# Patient Record
Sex: Male | Born: 1975 | Race: Black or African American | Hispanic: No | Marital: Married | Smoking: Never smoker
Health system: Southern US, Community
[De-identification: ages and names within clinical notes are randomized; demographics above are authoritative.]

## PROBLEM LIST (undated history)

## (undated) DIAGNOSIS — Z9109 Other allergy status, other than to drugs and biological substances: Secondary | ICD-10-CM

## (undated) HISTORY — PX: FRACTURE SURGERY: SHX138

---

## 1998-07-18 HISTORY — PX: OTHER SURGICAL HISTORY: SHX169

## 2000-07-18 HISTORY — PX: MENISCUS REPAIR: SHX5179

## 2005-07-18 HISTORY — PX: OTHER SURGICAL HISTORY: SHX169

## 2009-03-13 ENCOUNTER — Ambulatory Visit: Payer: Self-pay | Admitting: Diagnostic Radiology

## 2009-03-13 ENCOUNTER — Emergency Department (HOSPITAL_BASED_OUTPATIENT_CLINIC_OR_DEPARTMENT_OTHER): Admission: EM | Admit: 2009-03-13 | Discharge: 2009-03-13 | Payer: Self-pay | Admitting: Emergency Medicine

## 2010-07-18 HISTORY — PX: KNEE ARTHROSCOPY: SHX127

## 2012-02-02 ENCOUNTER — Other Ambulatory Visit: Payer: Self-pay | Admitting: Emergency Medicine

## 2012-02-02 ENCOUNTER — Ambulatory Visit (INDEPENDENT_AMBULATORY_CARE_PROVIDER_SITE_OTHER): Payer: 59 | Admitting: Emergency Medicine

## 2012-02-02 VITALS — BP 112/70 | HR 79 | Temp 97.8°F | Resp 18 | Ht 73.0 in | Wt 254.0 lb

## 2012-02-02 DIAGNOSIS — Z0000 Encounter for general adult medical examination without abnormal findings: Secondary | ICD-10-CM

## 2012-06-01 NOTE — Progress Notes (Signed)
  Subjective:    Patient ID: Wesley Boyer, male    DOB: 1976/03/07, 36 y.o.   MRN: [id number]  HPI  Physical.  No complaints  Review of Systems As per HPI, otherwise negative.      Objective:   Physical Exam  /\GEN: WDWN, NAD, Non-toxic, A & O x 3 HEENT: Atraumatic, Normocephalic. Neck supple. No masses, No LAD. Ears and Nose: No external deformity. CV: RRR, No M/G/R. No JVD. No thrill. No extra heart sounds. PULM: CTA B, no wheezes, crackles, rhonchi. No retractions. No resp. distress. No accessory muscle use. ABD: S, NT, ND, +BS. No rebound. No HSM. EXTR: No c/c/e NEURO Normal gait.  PSYCH: Normally interactive. Conversant. Not depressed or anxious appearing.  Calm demeanor.        Assessment & Plan:   Wellness examination Labs

## 2012-06-02 NOTE — Progress Notes (Signed)
Reviewed and agree.

## 2012-08-23 ENCOUNTER — Ambulatory Visit (INDEPENDENT_AMBULATORY_CARE_PROVIDER_SITE_OTHER): Payer: 59 | Admitting: Physician Assistant

## 2012-08-23 VITALS — BP 122/78 | HR 103 | Temp 98.2°F | Resp 17 | Ht 72.5 in | Wt 270.0 lb

## 2012-08-23 DIAGNOSIS — Z6836 Body mass index (BMI) 36.0-36.9, adult: Secondary | ICD-10-CM

## 2012-08-23 DIAGNOSIS — Z719 Counseling, unspecified: Secondary | ICD-10-CM

## 2012-08-23 NOTE — Progress Notes (Signed)
   Patient ID: Wesley Boyer MRN: [id number], DOB: 11-29-1975, 37 y.o. Date of Encounter: 08/23/2012, 11:18 AM  Primary Physician: No primary provider on file.  Chief Complaint: Insurance form completion  HPI: 37 y.o. year old male with history below presents for form completion. Patient had CPE here on 02/02/12. Labs not drawn at that time as patient had somewhere to be. Patient now presents for insurance form completion. Needs total cholesterol drawn. Had a bowl of cereal this morning around 5:30 am. Tries to eat healthy. Exercises 3-5 times weekly. No issues or complaints today.    History reviewed. No pertinent past medical history.   Home Meds: Prior to Admission medications   Medication Sig Start Date End Date Taking? Authorizing Provider  Multiple Vitamin (MULTIVITAMIN) tablet Take 1 tablet by mouth daily.    Historical Provider, MD    Allergies: No Known Allergies  History   Social History  . Marital Status: Married    Spouse Name: N/A    Number of Children: N/A  . Years of Education: N/A   Occupational History  . Not on file.   Social History Main Topics  . Smoking status: Never Smoker   . Smokeless tobacco: Not on file  . Alcohol Use: No     Comment: social  . Drug Use: No  . Sexually Active: Yes    Birth Control/ Protection: None   Other Topics Concern  . Not on file   Social History Narrative  . No narrative on file     Review of Systems: Constitutional: negative for chills, fever, night sweats, weight changes, or fatigue  Cardiovascular: negative for chest pain or palpitations Respiratory: negative for hemoptysis, wheezing, shortness of breath, or cough Abdominal: negative for abdominal pain, nausea, vomiting, diarrhea, or constipation Dermatological: negative for rash Neurologic: negative for headache, dizziness, or syncope All other systems reviewed and are otherwise negative with the exception to those above and in the HPI.   Physical Exam: Blood  pressure 122/78, pulse 103, temperature 98.2 F (36.8 C), temperature source Oral, resp. rate 17, height 6' 0.5" (1.842 m), weight 270 lb (122.471 kg), SpO2 99.00%., Body mass index is 36.12 kg/(m^2). General: Well developed, well nourished, in no acute distress. Head: Normocephalic, atraumatic, eyes without discharge, sclera non-icteric, nares are without discharge. Bilateral auditory canals clear, TM's are without perforation, pearly grey and translucent with reflective cone of light bilaterally. Oral cavity moist, posterior pharynx without exudate, erythema, peritonsillar abscess, or post nasal drip.  Neck: Supple. No thyromegaly. Full ROM. No lymphadenopathy. Lungs: Clear bilaterally to auscultation without wheezes, rales, or rhonchi. Breathing is unlabored. Heart: RRR with S1 S2. No murmurs, rubs, or gallops appreciated. Msk:  Strength and tone normal for age. Extremities/Skin: Warm and dry. No clubbing or cyanosis. No edema. No rashes or suspicious lesions. Neuro: Alert and oriented X 3. Moves all extremities spontaneously. Gait is normal. CNII-XII grossly in tact. Psych:  Responds to questions appropriately with a normal affect.   Labs: Total cholesterol pending.   ASSESSMENT AND PLAN:  37 y.o. year old male here for insurance form completion. -Total cholesterol drawn -Fill out form once lab is back -Holding form in my box   Signed, Eula Listen, PA-C 08/23/2012 11:18 AM

## 2012-09-03 ENCOUNTER — Ambulatory Visit (INDEPENDENT_AMBULATORY_CARE_PROVIDER_SITE_OTHER): Payer: Managed Care, Other (non HMO) | Admitting: Physician Assistant

## 2012-09-03 ENCOUNTER — Encounter: Payer: Self-pay | Admitting: Emergency Medicine

## 2012-09-03 VITALS — BP 143/90 | HR 113 | Temp 100.1°F | Resp 17 | Ht 72.5 in | Wt 274.0 lb

## 2012-09-03 DIAGNOSIS — IMO0001 Reserved for inherently not codable concepts without codable children: Secondary | ICD-10-CM

## 2012-09-03 DIAGNOSIS — M791 Myalgia, unspecified site: Secondary | ICD-10-CM

## 2012-09-03 DIAGNOSIS — R05 Cough: Secondary | ICD-10-CM

## 2012-09-03 DIAGNOSIS — R509 Fever, unspecified: Secondary | ICD-10-CM

## 2012-09-03 DIAGNOSIS — J029 Acute pharyngitis, unspecified: Secondary | ICD-10-CM

## 2012-09-03 LAB — POCT CBC
HCT, POC: 48.2 % (ref 43.5–53.7)
Hemoglobin: 16.3 g/dL (ref 14.1–18.1)
Lymph, poc: 1.5 (ref 0.6–3.4)
MCH, POC: 29 pg (ref 27–31.2)
MCHC: 33.8 g/dL (ref 31.8–35.4)
MCV: 85.8 fL (ref 80–97)
MPV: 9.2 fL (ref 0–99.8)
POC MID %: 10.2 %M (ref 0–12)
RBC: 5.62 M/uL (ref 4.69–6.13)
WBC: 5.9 10*3/uL (ref 4.6–10.2)

## 2012-09-03 NOTE — Progress Notes (Signed)
  Subjective:    Patient ID: Wesley Boyer, male    DOB: 06-30-1976, 37 y.o.   MRN: [id number]  HPI A 37 year old male presents with sore throat and muscle soreness since 4 am this morning.  The patient admits to "hacking red,yellow, green phlegm."  He admits to fevers throughout the day ranging from 99.0-100.3.  He has used Geneticist, molecular and Nyquil with no relief.  The sxs have progressively worsened throughout the day.  Pt denies congestion, ear pain, allergies, or hx of high BP.  No Known Allergies History reviewed. No pertinent past medical history. Past Surgical History  Procedure Laterality Date  . Knee arthroscopy  2012  . Meniscus repair  2002  . Right leg fracture repair  2007    October  . Left wrist repair  2000    Torn ligament   Family History  Problem Relation Age of Onset  . Stroke Mother   . Diabetes Mother   . Hypertension Father   . Clotting disorder Father   . Diabetes Brother    Review of Systems As above    Objective:   Physical Exam BP 143/90  Pulse 113  Temp(Src) 100.1 F (37.8 C) (Oral)  Resp 17  Ht 6' 0.5" (1.842 m)  Wt 274 lb (124.286 kg)  BMI 36.63 kg/m2  SpO2 97%  HEENT:  Erythema of TM in left ear.  No turbinate edema, no sinus tenderness.  Positive tonsillar and preauricular lymphadenopathy. Neck:  Supple.  No cervical lymphadenopathy. Skin:  Warm and moist.  Negative turgor. Lungs:  CTAB Heart:  Normal S1, S2.  Tachy.  No m/g/r.  Results for orders placed in visit on 09/03/12  POCT CBC      Result Value Range   WBC 5.9  4.6 - 10.2 K/uL   Lymph, poc 1.5  0.6 - 3.4   POC LYMPH PERCENT 26.2  10 - 50 %L   MID (cbc) 0.6  0 - 0.9   POC MID % 10.2  0 - 12 %M   POC Granulocyte 3.8  2 - 6.9   Granulocyte percent 63.6  37 - 80 %G   RBC 5.62  4.69 - 6.13 M/uL   Hemoglobin 16.3  14.1 - 18.1 g/dL   HCT, POC 40.9  81.1 - 53.7 %   MCV 85.8  80 - 97 fL   MCH, POC 29.0  27 - 31.2 pg   MCHC 33.8  31.8 - 35.4 g/dL   RDW, POC 91.4     Platelet  Count, POC 243  142 - 424 K/uL   MPV 9.2  0 - 99.8 fL  POCT INFLUENZA A/B      Result Value Range   Influenza A, POC Negative     Influenza B, POC Negative         Assessment & Plan:  Acute pharyngitis  Muscle pain - Plan: POCT Influenza A/B  Fever, unspecified - Plan: POCT CBC, POCT Influenza A/B  Cough - Plan: POCT Influenza A/B  Viral Illness.  Supportive care. RTC if symptoms worsen/persist.

## 2012-09-03 NOTE — Patient Instructions (Signed)
Advised patient to orally hydrate and rest.  Pt should take Tylenol for pain as needed.  RTC if symptoms worsen or if onset of new symptoms.

## 2012-10-03 ENCOUNTER — Encounter: Payer: Self-pay | Admitting: Family Medicine

## 2013-09-14 ENCOUNTER — Ambulatory Visit (INDEPENDENT_AMBULATORY_CARE_PROVIDER_SITE_OTHER): Payer: Managed Care, Other (non HMO) | Admitting: Physician Assistant

## 2013-09-14 VITALS — BP 122/72 | HR 103 | Temp 98.5°F | Resp 20 | Ht 72.5 in | Wt 279.0 lb

## 2013-09-14 DIAGNOSIS — R51 Headache: Secondary | ICD-10-CM

## 2013-09-14 DIAGNOSIS — R519 Headache, unspecified: Secondary | ICD-10-CM

## 2013-09-14 DIAGNOSIS — M62838 Other muscle spasm: Secondary | ICD-10-CM

## 2013-09-14 DIAGNOSIS — Z0000 Encounter for general adult medical examination without abnormal findings: Secondary | ICD-10-CM

## 2013-09-14 DIAGNOSIS — Z833 Family history of diabetes mellitus: Secondary | ICD-10-CM

## 2013-09-14 DIAGNOSIS — R000 Tachycardia, unspecified: Secondary | ICD-10-CM

## 2013-09-14 LAB — POCT URINALYSIS DIPSTICK
BILIRUBIN UA: NEGATIVE
Glucose, UA: NEGATIVE
Leukocytes, UA: NEGATIVE
NITRITE UA: NEGATIVE
PH UA: 7
Protein, UA: NEGATIVE
RBC UA: NEGATIVE
SPEC GRAV UA: 1.02
UROBILINOGEN UA: 0.2

## 2013-09-14 MED ORDER — CYCLOBENZAPRINE HCL 5 MG PO TABS: 5.0000 mg | ORAL_TABLET | Freq: Three times a day (TID) | ORAL | Status: DC | PRN

## 2013-09-14 NOTE — Progress Notes (Signed)
   Subjective:    Patient ID: Wesley Boyer, male    DOB: 09-02-1975, 38 y.o.   MRN: [id number]  HPI Pt presents to clinic for his CPE.  He is doing well other than some headaches that he attributes to neck muscle spasms.  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 He has never seen an eye dr.  He goes to the dentist regularly.  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  Does Yoga regularly.  He cannot lift weight though he wants to because he build to much muscle mass when he does work-out.  Review of Systems  Constitutional: Negative.   Eyes: Negative.   Respiratory: Negative.   Cardiovascular: Negative.   Gastrointestinal: Negative.   Endocrine: Negative.   Genitourinary: Negative.   Musculoskeletal: Negative.   Skin: Negative.   Allergic/Immunologic: Negative.   Neurological: Positive for headaches (needs a different pillow - feels like he has muscle spasms that are contributing to his headaches).  Hematological: Negative.   Psychiatric/Behavioral: Negative.        Objective:   Physical Exam  Vitals reviewed. Constitutional: He is oriented to person, place, and time. He appears well-developed and well-nourished.  HENT:  Head: Normocephalic and atraumatic.  Right Ear: Hearing, tympanic membrane, external ear and ear canal normal.  Left Ear: Hearing, tympanic membrane, external ear and ear canal normal.  Nose: Nose normal.  Mouth/Throat: Uvula is midline, oropharynx is clear and moist and mucous membranes are normal.  Eyes: Conjunctivae are normal. Pupils are equal, round, and reactive to light.  Neck: Normal range of motion. Neck supple.  Cardiovascular: Regular rhythm and normal heart sounds.  Tachycardia present.   No murmur heard. Pulmonary/Chest: Effort normal and breath sounds normal. He has no wheezes.  Abdominal: Soft. Bowel sounds are normal. Hernia  confirmed negative in the right inguinal area and confirmed negative in the left inguinal area.  Genitourinary: Testes normal and penis normal.  Musculoskeletal:       Cervical back: He exhibits spasm (trapezius). He exhibits normal range of motion.  Pt is very muscular.  Neurological: He is alert and oriented to person, place, and time. He has normal reflexes.  Skin: Skin is warm and dry.  Psychiatric: He has a normal mood and affect. His behavior is normal. Judgment and thought content normal.      Assessment & Plan:  Annual physical exam - Plan: POCT urinalysis dipstick, Lipid panel, CBC  Tachycardia - Last 3 visits pt's HR has been >100- so we will check thyroid - pt is not aware of his elevated HR.  Plan: COMPLETE METABOLIC PANEL WITH GFR, TSH  Family history of diabetes mellitus - Plan: COMPLETE METABOLIC PANEL WITH GFR, Lipid panel  Headache  Muscle spasm - Plan: cyclobenzaprine (FLEXERIL) 5 MG tablet  He does not want his lab work done today because he ate pizza about 2 hours ago.  He will RTC in 2-3 days for fasting labs.  We will start Flexeril to help with his muscle spasms in hopes that we can improve his HAs.  He is will also use OTC NSAID intermittently to help with the pain.  He plans on getting a new pillow but it is a custom made pillow so he has to order it.    Benny LennertSarah Weber [facility] 09/14/2013 6:46 PM

## 2014-01-30 ENCOUNTER — Telehealth: Payer: Self-pay | Admitting: *Deleted

## 2014-01-30 NOTE — Telephone Encounter (Addendum)
Patient dropped off physical form, Dr. Katrinka BlazingSmith was able to fill out everything except for vision. Ok per Dr. Katrinka BlazingSmith to use vision from 2014, with proper documentation. Left message for patient regarding this. Awaiting return phone call from patient for ok to use old vision or if he would like to come back for vision exam.

## 2014-01-31 ENCOUNTER — Telehealth: Payer: Self-pay | Admitting: *Deleted

## 2014-01-31 NOTE — Telephone Encounter (Signed)
Form received to complete for CPE- added vision from CPE 2014. Copy sent to scan.  Patients wife notified to pick up. [phone]. Completed form in pick up drawer.

## 2014-07-15 ENCOUNTER — Ambulatory Visit (INDEPENDENT_AMBULATORY_CARE_PROVIDER_SITE_OTHER): Payer: Managed Care, Other (non HMO) | Admitting: Physician Assistant

## 2014-07-15 VITALS — BP 128/82 | HR 108 | Temp 99.0°F | Resp 17 | Ht 73.0 in | Wt 272.0 lb

## 2014-07-15 DIAGNOSIS — J069 Acute upper respiratory infection, unspecified: Secondary | ICD-10-CM

## 2014-07-15 MED ORDER — GUAIFENESIN ER 1200 MG PO TB12: 1.0000 | ORAL_TABLET | Freq: Two times a day (BID) | ORAL | Status: DC | PRN

## 2014-07-15 MED ORDER — LORATADINE-PSEUDOEPHEDRINE ER 5-120 MG PO TB12: 1.0000 | ORAL_TABLET | Freq: Every day | ORAL | Status: AC

## 2014-07-15 MED ORDER — OXYMETAZOLINE HCL 0.05 % NA SOLN: 2.0000 | Freq: Every day | NASAL | Status: AC

## 2014-07-15 NOTE — Progress Notes (Signed)
IDENTIFYING INFORMATION  Wesley MaxinMaurice Boyer / DOB: 1975/10/18 / MRN: [id number]  The patient  does not have a problem list on file.  SUBJECTIVE  CC: Nasal Congestion and Sore Throat   HPI: [name] is a 38 y.o. y.o. well appearing male presenting with 2 weeks of nasal congestion. He started feeling better 4 days ago, but started having a sore throat 2 days ago, and his nasal congestion came back.   He has not missed any work.  He has been drinking copious fluids.  He has tried vitamin C along with Alka Seltzer cough and cold OTC which gives him good relief for four hours.  He reports his daughter is in preschool and has been sick with a cold for the last few days.  He would like a Z-Pack to "clear things up."     He  has no past medical history on file.    He has a current medication list which includes the following prescription(s): vitamin c, multivitamin, and cyclobenzaprine.  [name] has No Known Allergies. He  reports that he has never smoked. He does not have any smokeless tobacco history on file. He reports that he drinks alcohol. He reports that he does not use illicit drugs. He  reports that he currently engages in sexual activity. He reports using the following method of birth control/protection: None.  The patient  has past surgical history that includes Knee arthroscopy (2012); Meniscus repair (2002); right leg fracture repair (2007); and Left wrist repair (2000).  His family history includes Atrial fibrillation in his brother; Clotting disorder in his father; Diabetes in his brother and mother; Hypertension in his father; Stroke in his mother.  Review of Systems  Constitutional: Negative for fever, chills, weight loss, malaise/fatigue and diaphoresis.  HENT: Positive for congestion and sore throat.   Eyes: Negative for discharge and redness.  Respiratory: Negative for cough, shortness of breath and wheezing.   Cardiovascular: Negative for chest pain and palpitations.    Gastrointestinal: Negative.   Genitourinary: Negative.   Skin: Negative.   Neurological: Negative for dizziness, weakness and headaches.    OBJECTIVE  Blood pressure 128/82, pulse 108 (rechecked by me on auscultation at 84 bpm), temperature 99 F (37.2 C), temperature source Oral, resp. rate 17, height 6\' 1"  (1.854 m), weight 272 lb (123.378 kg), SpO2 96 %. The patient's body mass index is 35.89 kg/(m^2).  Physical Exam  Constitutional: He is oriented to person, place, and time. He appears well-developed and well-nourished. No distress.  HENT:  Right Ear: Hearing, external ear and ear canal normal. No drainage. Tympanic membrane is not retracted and not bulging. No middle ear effusion.  Left Ear: Hearing, external ear and ear canal normal. No drainage. Tympanic membrane is not retracted and not bulging.  No middle ear effusion.  Nose: Mucosal edema and rhinorrhea (clear) present. Right sinus exhibits no maxillary sinus tenderness and no frontal sinus tenderness. Left sinus exhibits no maxillary sinus tenderness and no frontal sinus tenderness.  Mouth/Throat: Uvula is midline and mucous membranes are normal. Posterior oropharyngeal erythema present. No oropharyngeal exudate, posterior oropharyngeal edema or tonsillar abscesses.  Cardiovascular: Normal rate, regular rhythm and normal heart sounds.   Respiratory: Effort normal and breath sounds normal. He has no wheezes.  Neurological: He is alert and oriented to person, place, and time.  Skin: Skin is warm and dry. He is not diaphoretic. No erythema. No pallor.  Psychiatric: He has a normal mood and affect. His behavior is  normal. Judgment and thought content normal.    No results found for this or any previous visit (from the past 24 hour(s)).  ASSESSMENT & PLAN  Wesley AlstromMaurice was seen today for nasal congestion and sore throat.  Diagnoses and associated orders for this visit:  Viral URI:  The lack of findings on physical exam are  reassuring.   - loratadine-pseudoephedrine (CLARITIN-D 12 HOUR) 5-120 MG per tablet; Take 1 tablet by mouth daily with breakfast. - oxymetazoline (AFRIN NASAL SPRAY) 0.05 % nasal spray; Place 2 sprays into both nostrils at bedtime. Three days only.  - Guaifenesin (MUCINEX MAXIMUM STRENGTH) 1200 MG TB12; Take 1 tablet (1,200 mg total) by mouth every 12 (twelve) hours as needed.    The patient was instructed to to call or comeback to clinic as needed, or should symptoms warrant.  Deliah BostonMichael Clark, MHS, PA-C [facility] 07/15/2014 2:19 PM

## 2016-06-04 ENCOUNTER — Encounter (HOSPITAL_BASED_OUTPATIENT_CLINIC_OR_DEPARTMENT_OTHER): Payer: Self-pay | Admitting: *Deleted

## 2016-06-04 ENCOUNTER — Emergency Department (HOSPITAL_BASED_OUTPATIENT_CLINIC_OR_DEPARTMENT_OTHER)
Admission: EM | Admit: 2016-06-04 | Discharge: 2016-06-04 | Disposition: A | Discharge status: Home / Self Care | Payer: BLUE CROSS/BLUE SHIELD | Attending: Emergency Medicine | Admitting: Emergency Medicine

## 2016-06-04 DIAGNOSIS — Z23 Encounter for immunization: Secondary | ICD-10-CM | POA: Diagnosis not present

## 2016-06-04 DIAGNOSIS — Y999 Unspecified external cause status: Secondary | ICD-10-CM | POA: Insufficient documentation

## 2016-06-04 DIAGNOSIS — S0101XA Laceration without foreign body of scalp, initial encounter: Secondary | ICD-10-CM | POA: Diagnosis present

## 2016-06-04 DIAGNOSIS — W208XXA Other cause of strike by thrown, projected or falling object, initial encounter: Secondary | ICD-10-CM | POA: Insufficient documentation

## 2016-06-04 DIAGNOSIS — Y9289 Other specified places as the place of occurrence of the external cause: Secondary | ICD-10-CM | POA: Diagnosis not present

## 2016-06-04 DIAGNOSIS — Y9389 Activity, other specified: Secondary | ICD-10-CM | POA: Diagnosis not present

## 2016-06-04 MED ORDER — LIDOCAINE-EPINEPHRINE (PF) 2 %-1:200000 IJ SOLN
INTRAMUSCULAR | Status: AC
  Administered 2016-06-04: 20 mL
  Filled 2016-06-04: qty 20

## 2016-06-04 MED ORDER — LIDOCAINE-EPINEPHRINE (PF) 2 %-1:200000 IJ SOLN
INTRAMUSCULAR | Status: AC
  Filled 2016-06-04: qty 20

## 2016-06-04 MED ORDER — TETANUS-DIPHTH-ACELL PERTUSSIS 5-2.5-18.5 LF-MCG/0.5 IM SUSP
0.5000 mL | Freq: Once | INTRAMUSCULAR | Status: AC
  Administered 2016-06-04: 0.5 mL via INTRAMUSCULAR
  Filled 2016-06-04: qty 0.5

## 2016-06-04 MED ORDER — LIDOCAINE-EPINEPHRINE (PF) 2 %-1:200000 IJ SOLN
10.0000 mL | Freq: Once | INTRAMUSCULAR | Status: AC
  Administered 2016-06-04: 10 mL via INTRADERMAL

## 2016-06-04 MED ORDER — TRAMADOL HCL 50 MG PO TABS
50.0000 mg | ORAL_TABLET | Freq: Four times a day (QID) | ORAL | 0 refills | Status: AC | PRN
Start: 2016-06-04 — End: ?

## 2016-06-04 NOTE — ED Provider Notes (Signed)
[facility] Provider Note   CSN: [id number] Arrival date & time: 06/04/16  1921   By signing my name below, I, Clarisse GougeXavier Herndon, attest that this documentation has been prepared under the direction and in the presence of Fayrene HelperBowie Shonika Kolasinski. Electronically Signed: Clarisse GougeXavier Herndon, Scribe. 06/04/16. 8:04 PM.   History   Chief Complaint Chief Complaint  Patient presents with  . Head Laceration   The history is provided by the patient. No language interpreter was used.   HPI Comments: Wesley Boyer is a 40 y.o. male who presents to the Emergency Department s/p a head laceration that occurred ~ 6:15PM today. He states that the lid of the grill hit his head as he was preparing to return home from a cookout. Pt denies syncope, nausea, vomiting, numbness and tingling. Unknown last tetanus. Report pain is sharp, 6/10, non radiating.      History reviewed. No pertinent past medical history.  There are no active problems to display for this patient.   Past Surgical History:  Procedure Laterality Date  . FRACTURE SURGERY     elbows knees and wrist   . KNEE ARTHROSCOPY  2012  . Left wrist repair  2000   Torn ligament  . MENISCUS REPAIR  2002  . right leg fracture repair  2007   October       Home Medications    Prior to Admission medications   Medication Sig Start Date End Date Taking? Authorizing Provider  Multiple Vitamin (MULTIVITAMIN) tablet Take 1 tablet by mouth daily.    Historical Provider, MD    Family History Family History  Problem Relation Age of Onset  . Stroke Mother   . Diabetes Mother   . Hypertension Father   . Clotting disorder Father   . Atrial fibrillation Brother   . Diabetes Brother     Social History Social History  Substance Use Topics  . Smoking status: Never Smoker  . Smokeless tobacco: Never Used  . Alcohol use Yes     Comment: social     Allergies   Patient has no known allergies.   Review of Systems Review of Systems    Gastrointestinal: Negative for nausea and vomiting.  Skin: Positive for wound.  Neurological: Negative for syncope, weakness and numbness.     Physical Exam Updated Vital Signs BP 152/96 (BP Location: Right Arm)   Pulse 87   Temp 98.5 F (36.9 C) (Oral)   Resp 16   Ht 6\' 1"  (1.854 m)   Wt 250 lb (113.4 kg)   SpO2 100%   BMI 32.98 kg/m   Physical Exam  Constitutional: He is oriented to person, place, and time. He appears well-developed and well-nourished.  HENT:  Head: Normocephalic.    Eyes: EOM are normal.  Neck: Normal range of motion.  Pulmonary/Chest: Effort normal.  Abdominal: He exhibits no distension.  Musculoskeletal: Normal range of motion.  Neurological: He is alert and oriented to person, place, and time.  Psychiatric: He has a normal mood and affect.  Nursing note and vitals reviewed.    ED Treatments / Results  DIAGNOSTIC STUDIES: Oxygen Saturation is 100% on RA, normal by my interpretation.    COORDINATION OF CARE: 8:05 PM Discussed treatment plan with pt at bedside and pt agreed to plan.   Labs (all labs ordered are listed, but only abnormal results are displayed) Labs Reviewed - No data to display  EKG  EKG Interpretation None       Radiology No results  found.  Procedures Procedures (including critical care time)  LACERATION REPAIR Performed by: Fayrene HelperRAN,Shanitha Twining Authorized by: Fayrene HelperRAN,Harshaan Whang Consent: Verbal consent obtained. Risks and benefits: risks, benefits and alternatives were discussed Consent given by: patient Patient identity confirmed: provided demographic data Prepped and Draped in normal sterile fashion Wound explored  Laceration Location: occiput of scalp  Laceration Length: 3cm  No Foreign Bodies seen or palpated  Anesthesia: local infiltration  Local anesthetic: lidocaine 2% w epinephrine  Anesthetic total: 4 ml  Irrigation method: syringe Amount of cleaning: standard  Skin closure: surgical staples  Number  of staples: 4  Technique: surgical staples  Patient tolerance: Patient tolerated the procedure well with no immediate complications.   Medications Ordered in ED Medications - No data to display   Initial Impression / Assessment and Plan / ED Course  I have reviewed the triage vital signs and the nursing notes.  Pertinent labs & imaging results that were available during my care of the patient were reviewed by me and considered in my medical decision making (see chart for details).  Clinical Course     Tetanus updated in the ED. Laceration occurred < 12 hours prior to repair. Discussed laceration care with pt and answered questions. Pt to f-u for suture removal in 5-7 days and wound check sooner should there be signs of dehiscence or infection. Pt is hemodynamically stable with no complaints prior to dc.     Final Clinical Impressions(s) / ED Diagnoses   Final diagnoses:  Laceration of scalp, initial encounter    New Prescriptions New Prescriptions   TRAMADOL (ULTRAM) 50 MG TABLET    Take 1 tablet (50 mg total) by mouth every 6 (six) hours as needed for moderate pain.  I personally performed the services described in this documentation, which was scribed in my presence. The recorded information has been reviewed and is accurate.      Fayrene HelperBowie Marcel Sorter, PA-C 06/04/16 2117    Rolan BuccoMelanie Belfi, MD 06/04/16 2231

## 2016-06-04 NOTE — ED Notes (Signed)
Pt given d/c instructions as per chart. Verbalizes understanding. No questions. Rx x 1 with precautions 

## 2016-06-04 NOTE — ED Notes (Signed)
Pt alert, NAD, calm, interactive, resps e/u, no dyspnea noted, steady gait, into exam room from triage.

## 2016-06-04 NOTE — ED Triage Notes (Signed)
Pt states that a grill fell on his head this PM while at a tail gating party. Laceration to top of head bleeding controlled at present denies LOC alert and oriented pupils equal and reactive. MAE.

## 2016-06-04 NOTE — Discharge Instructions (Signed)
You may wash your scalp after 12-14 hrs.  Monitor wound for signs of infection.  Take pain medication as needed.  Return in 5-7 days for staples removal.

## 2016-06-04 NOTE — ED Notes (Signed)
Requesting juice, given by EDPA.

## 2016-06-04 NOTE — ED Notes (Signed)
Pt reports he was loading up to go home and the top of the grill fell on his head causing a laceration.

## 2016-11-26 ENCOUNTER — Emergency Department (HOSPITAL_BASED_OUTPATIENT_CLINIC_OR_DEPARTMENT_OTHER): Payer: BLUE CROSS/BLUE SHIELD

## 2016-11-26 ENCOUNTER — Encounter (HOSPITAL_BASED_OUTPATIENT_CLINIC_OR_DEPARTMENT_OTHER): Payer: Self-pay | Admitting: *Deleted

## 2016-11-26 ENCOUNTER — Emergency Department (HOSPITAL_BASED_OUTPATIENT_CLINIC_OR_DEPARTMENT_OTHER)
Admission: EM | Admit: 2016-11-26 | Discharge: 2016-11-26 | Disposition: A | Discharge status: Home / Self Care | Payer: BLUE CROSS/BLUE SHIELD | Attending: Emergency Medicine | Admitting: Emergency Medicine

## 2016-11-26 DIAGNOSIS — R066 Hiccough: Secondary | ICD-10-CM | POA: Diagnosis not present

## 2016-11-26 HISTORY — DX: Other allergy status, other than to drugs and biological substances: Z91.09

## 2016-11-26 LAB — COMPREHENSIVE METABOLIC PANEL
ALBUMIN: 3.6 g/dL (ref 3.5–5.0)
ALK PHOS: 67 U/L (ref 38–126)
ALT: 30 U/L (ref 17–63)
AST: 21 U/L (ref 15–41)
Anion gap: 8 (ref 5–15)
BILIRUBIN TOTAL: 0.6 mg/dL (ref 0.3–1.2)
BUN: 13 mg/dL (ref 6–20)
CALCIUM: 9 mg/dL (ref 8.9–10.3)
CO2: 29 mmol/L (ref 22–32)
CREATININE: 1.09 mg/dL (ref 0.61–1.24)
Chloride: 101 mmol/L (ref 101–111)
GFR calc Af Amer: >60 mL/min (ref >60)
GLUCOSE: 98 mg/dL (ref 65–99)
Potassium: 3.6 mmol/L (ref 3.5–5.1)
SODIUM: 138 mmol/L (ref 135–145)
TOTAL PROTEIN: 6.9 g/dL (ref 6.5–8.1)

## 2016-11-26 LAB — CBC WITH DIFFERENTIAL/PLATELET
Basophils Absolute: 0 10*3/uL (ref 0.0–0.1)
Basophils Relative: 1 %
EOS ABS: 0 10*3/uL (ref 0.0–0.7)
Eosinophils Relative: 1 %
HEMATOCRIT: 46.6 % (ref 39.0–52.0)
HEMOGLOBIN: 16.1 g/dL (ref 13.0–17.0)
LYMPHS ABS: 2.6 10*3/uL (ref 0.7–4.0)
LYMPHS PCT: 30 %
MCH: 29.1 pg (ref 26.0–34.0)
MCHC: 34.5 g/dL (ref 30.0–36.0)
MCV: 84.3 fL (ref 78.0–100.0)
MONOS PCT: 12 %
Monocytes Absolute: 1 10*3/uL (ref 0.1–1.0)
NEUTROS ABS: 4.8 10*3/uL (ref 1.7–7.7)
NEUTROS PCT: 56 %
Platelets: 205 10*3/uL (ref 150–400)
RBC: 5.53 MIL/uL (ref 4.22–5.81)
RDW: 14.6 % (ref 11.5–15.5)
WBC: 8.5 10*3/uL (ref 4.0–10.5)

## 2016-11-26 LAB — TROPONIN I

## 2016-11-26 MED ORDER — CHLORPROMAZINE HCL 25 MG PO TABS: 25.0000 mg | ORAL_TABLET | Freq: Four times a day (QID) | ORAL | 0 refills | Status: AC | PRN

## 2016-11-26 MED ORDER — CHLORPROMAZINE HCL 25 MG PO TABS
25.0000 mg | ORAL_TABLET | Freq: Once | ORAL | Status: AC
  Administered 2016-11-26: 25 mg via ORAL
  Filled 2016-11-26: qty 1

## 2016-11-26 NOTE — ED Triage Notes (Addendum)
Patient states three days ago he developed hiccups.  States the hiccups have been constant since they began.  States on 11/23/16 he was diagnosed with the Flu and started on Tamiflu, Prednisone, and Augmentin.  States he has tried multiple home remedies with no relief.  States he also has tightness in the breast area, upper chest, and scapula area.

## 2016-11-26 NOTE — ED Notes (Signed)
ED Provider at bedside. 

## 2016-11-26 NOTE — Discharge Instructions (Signed)
Urinary hiccups are likely secondary to prednisone use. Discontinue use of the steroid, and use this Thorazine prescription for continued hiccups.

## 2016-11-26 NOTE — ED Provider Notes (Signed)
[facility]   CSN: [id number] Arrival date & time: 11/26/16  4098     History   Chief Complaint Chief Complaint  Patient presents with  . Hiccups    x 48 hours    HPI Wesley Boyer is a 41 y.o. male.  HPI   Recently diagnosed with influenza, started on tamiflu, prednisone, azithromycin. Reports symptoms of cough, congestion have improved. 3 days ago began rx, and 2 days ago developed hiccups. Reports severe hiccups every 20s for past 48hr.  No chest pain , no dyspnea, no abdominal pain.  Past Medical History:  Diagnosis Date  . Environmental allergies     There are no active problems to display for this patient.   Past Surgical History:  Procedure Laterality Date  . FRACTURE SURGERY     elbows knees and wrist   . KNEE ARTHROSCOPY  2012  . Left wrist repair  2000   Torn ligament  . MENISCUS REPAIR  2002  . right leg fracture repair  2007   October       Home Medications    Prior to Admission medications   Medication Sig Start Date End Date Taking? Authorizing Provider  Ascorbic Acid (VITAMIN C ER PO) Take by mouth.   Yes [provider]  Omega-3 Fatty Acids (FISH OIL BURP-LESS PO) Take by mouth.   Yes [provider]  chlorproMAZINE (THORAZINE) 25 MG tablet Take 1 tablet (25 mg total) by mouth 4 (four) times daily as needed for hiccoughs. 11/26/16 11/29/16  Alvira Monday, MD  Multiple Vitamin (MULTIVITAMIN) tablet Take 1 tablet by mouth daily.    [provider]  traMADol (ULTRAM) 50 MG tablet Take 1 tablet (50 mg total) by mouth every 6 (six) hours as needed for moderate pain. 06/04/16   Fayrene Helper, PA-C    Family History Family History  Problem Relation Age of Onset  . Stroke Mother   . Diabetes Mother   . Hypertension Father   . Clotting disorder Father   . Atrial fibrillation Brother   . Diabetes Brother     Social History Social History  Substance Use Topics  . Smoking status: Never  Smoker  . Smokeless tobacco: Never Used  . Alcohol use Yes     Comment: social     Allergies   Patient has no known allergies.   Review of Systems Review of Systems  Constitutional: Negative for fever.  HENT: Negative for sore throat.   Eyes: Negative for visual disturbance.  Respiratory: Negative for shortness of breath.   Cardiovascular: Negative for chest pain.  Gastrointestinal: Negative for abdominal pain.  Genitourinary: Negative for difficulty urinating.  Musculoskeletal: Negative for back pain and neck stiffness.  Skin: Negative for rash.  Neurological: Negative for syncope and headaches.     Physical Exam Updated Vital Signs BP (!) 169/97 (BP Location: Left Arm)   Pulse 80   Temp 99 F (37.2 C) (Oral)   Resp 18   Ht 6\' 1"  (1.854 m)   Wt 255 lb (115.7 kg)   SpO2 100%   BMI 33.64 kg/m   Physical Exam  Constitutional: He is oriented to person, place, and time. He appears well-developed and well-nourished. No distress.  HENT:  Head: Normocephalic and atraumatic.  Eyes: Conjunctivae and EOM are normal.  Neck: Normal range of motion.  Cardiovascular: Normal rate, regular rhythm, normal heart sounds and intact distal pulses.  Exam reveals no gallop and no friction rub.   No  murmur heard. Pulmonary/Chest: Effort normal and breath sounds normal. No respiratory distress. He has no wheezes. He has no rales.  Abdominal: Soft. He exhibits no distension. There is no tenderness. There is no guarding.  Musculoskeletal: He exhibits no edema.  Neurological: He is alert and oriented to person, place, and time.  Skin: Skin is warm and dry. He is not diaphoretic.  Nursing note and vitals reviewed.    ED Treatments / Results  Labs (all labs ordered are listed, but only abnormal results are displayed) Labs Reviewed  CBC WITH DIFFERENTIAL/PLATELET  COMPREHENSIVE METABOLIC PANEL  TROPONIN I    EKG  EKG Interpretation  Date/Time:  Saturday Nov 26 2016 10:04:05  EDT Ventricular Rate:  77 PR Interval:    QRS Duration: 93 QT Interval:  344 QTC Calculation: 390 R Axis:   69 Text Interpretation:  Sinus rhythm Probable left atrial enlargement Probable left ventricular hypertrophy Borderline T abnormalities, inferior leads ST elev, probable normal early repol pattern No previous ECGs available Confirmed by [facility] Center[facility] MD, Denny PeonERIN ([id number]) on 11/26/2016 10:21:30 AM Also confirmed by [facility], Bryahna Lesko ([id number]), editor Elita QuickWatlington, Beverly ([id number])  on 11/26/2016 10:57:14 AM       Radiology Dg Chest 2 View  Result Date: 11/26/2016 CLINICAL DATA:  41 year old male with persistent hiccups for the past 48 hours EXAM: CHEST  2 VIEW COMPARISON:  Prior chest x-ray 09/06/2008 FINDINGS: The lungs are clear and negative for focal airspace consolidation, pulmonary edema or suspicious pulmonary nodule. No pleural effusion or pneumothorax. Cardiac and mediastinal contours are within normal limits. No acute fracture or lytic or blastic osseous lesions. The visualized upper abdominal bowel gas pattern is unremarkable. IMPRESSION: Negative chest x-ray. Electronically Signed   By: Malachy MoanHeath  McCullough M.D.   On: 11/26/2016 10:54    Procedures Procedures (including critical care time)  Medications Ordered in ED Medications  chlorproMAZINE (THORAZINE) tablet 25 mg (25 mg Oral Given 11/26/16 1034)     Initial Impression / Assessment and Plan / ED Course  I have reviewed the triage vital signs and the nursing notes.  Pertinent labs & imaging results that were available during my care of the patient were reviewed by me and considered in my medical decision making (see chart for details).    41yo male with history of recent flu, started on steroids, tamiflu, presents with concern for hiccups for 48 hr.  XR without abnormalities. No sign of cardiac ischemia, ECG and troponin without acute changes. No electrolyte abnormalities.  Given thorazine with improvement in hiccups.  Given rx  for same. Recommend discontinuing prednisone as this is likely etiology of hiccups. Patient discharged in stable condition with understanding of reasons to return.   Final Clinical Impressions(s) / ED Diagnoses   Final diagnoses:  Intractable hiccups    New Prescriptions Discharge Medication List as of 11/26/2016 11:36 AM    START taking these medications   Details  chlorproMAZINE (THORAZINE) 25 MG tablet Take 1 tablet (25 mg total) by mouth 4 (four) times daily as needed for hiccoughs., Starting Sat 11/26/2016, Until Tue 11/29/2016, Print         Alvira MondaySchlossman, Apolonia Ellwood, MD 11/26/16 2109

## 2018-09-27 IMAGING — CR DG CHEST 2V
2 series · 2 of 2 positions shown · non-contrast
Comparison: Prior chest x-ray 09/06/2008

CLINICAL DATA: 41-year-old male with persistent hiccups for the
past 48 hours

EXAM:
CHEST  2 VIEW

[w chest pa]
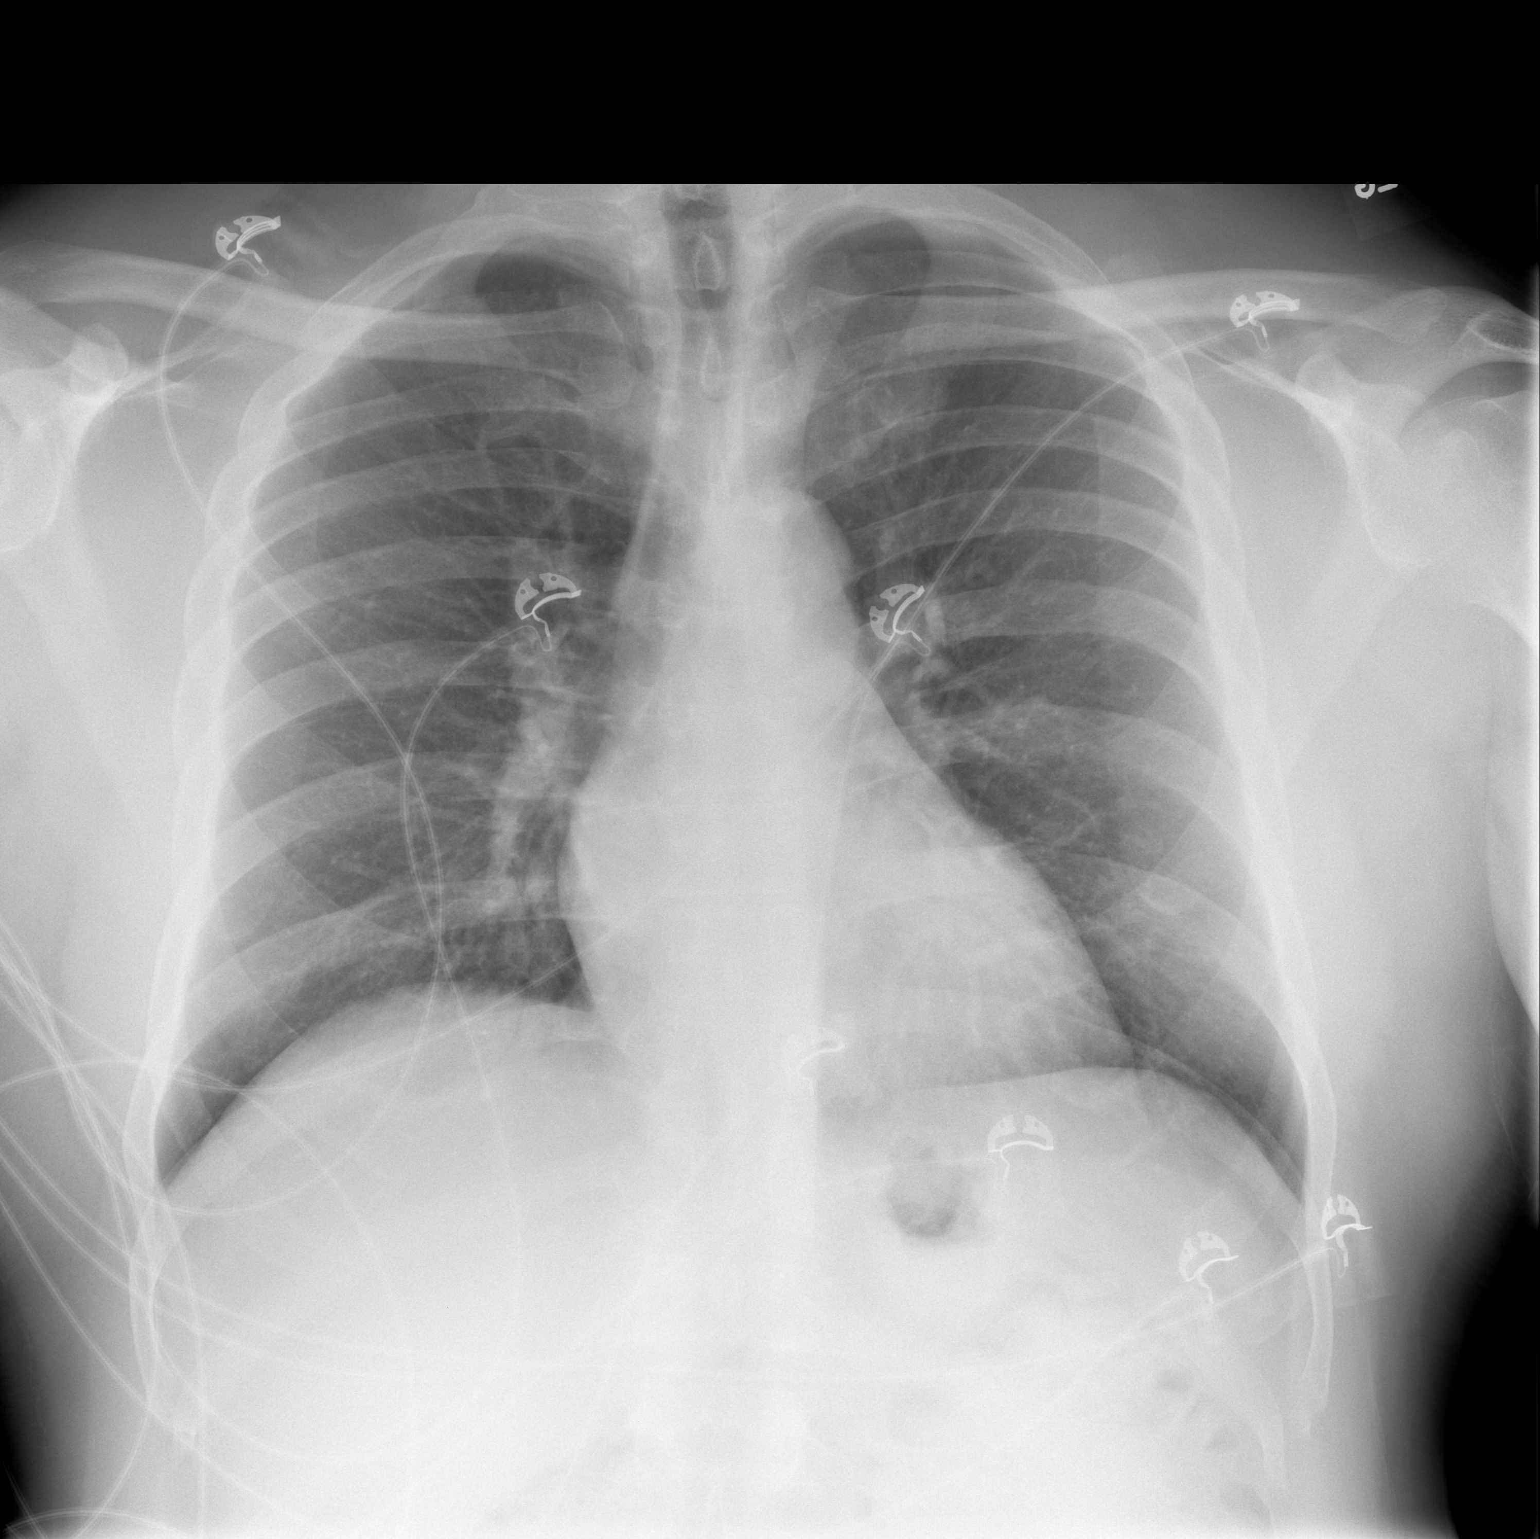

[w chest lat]
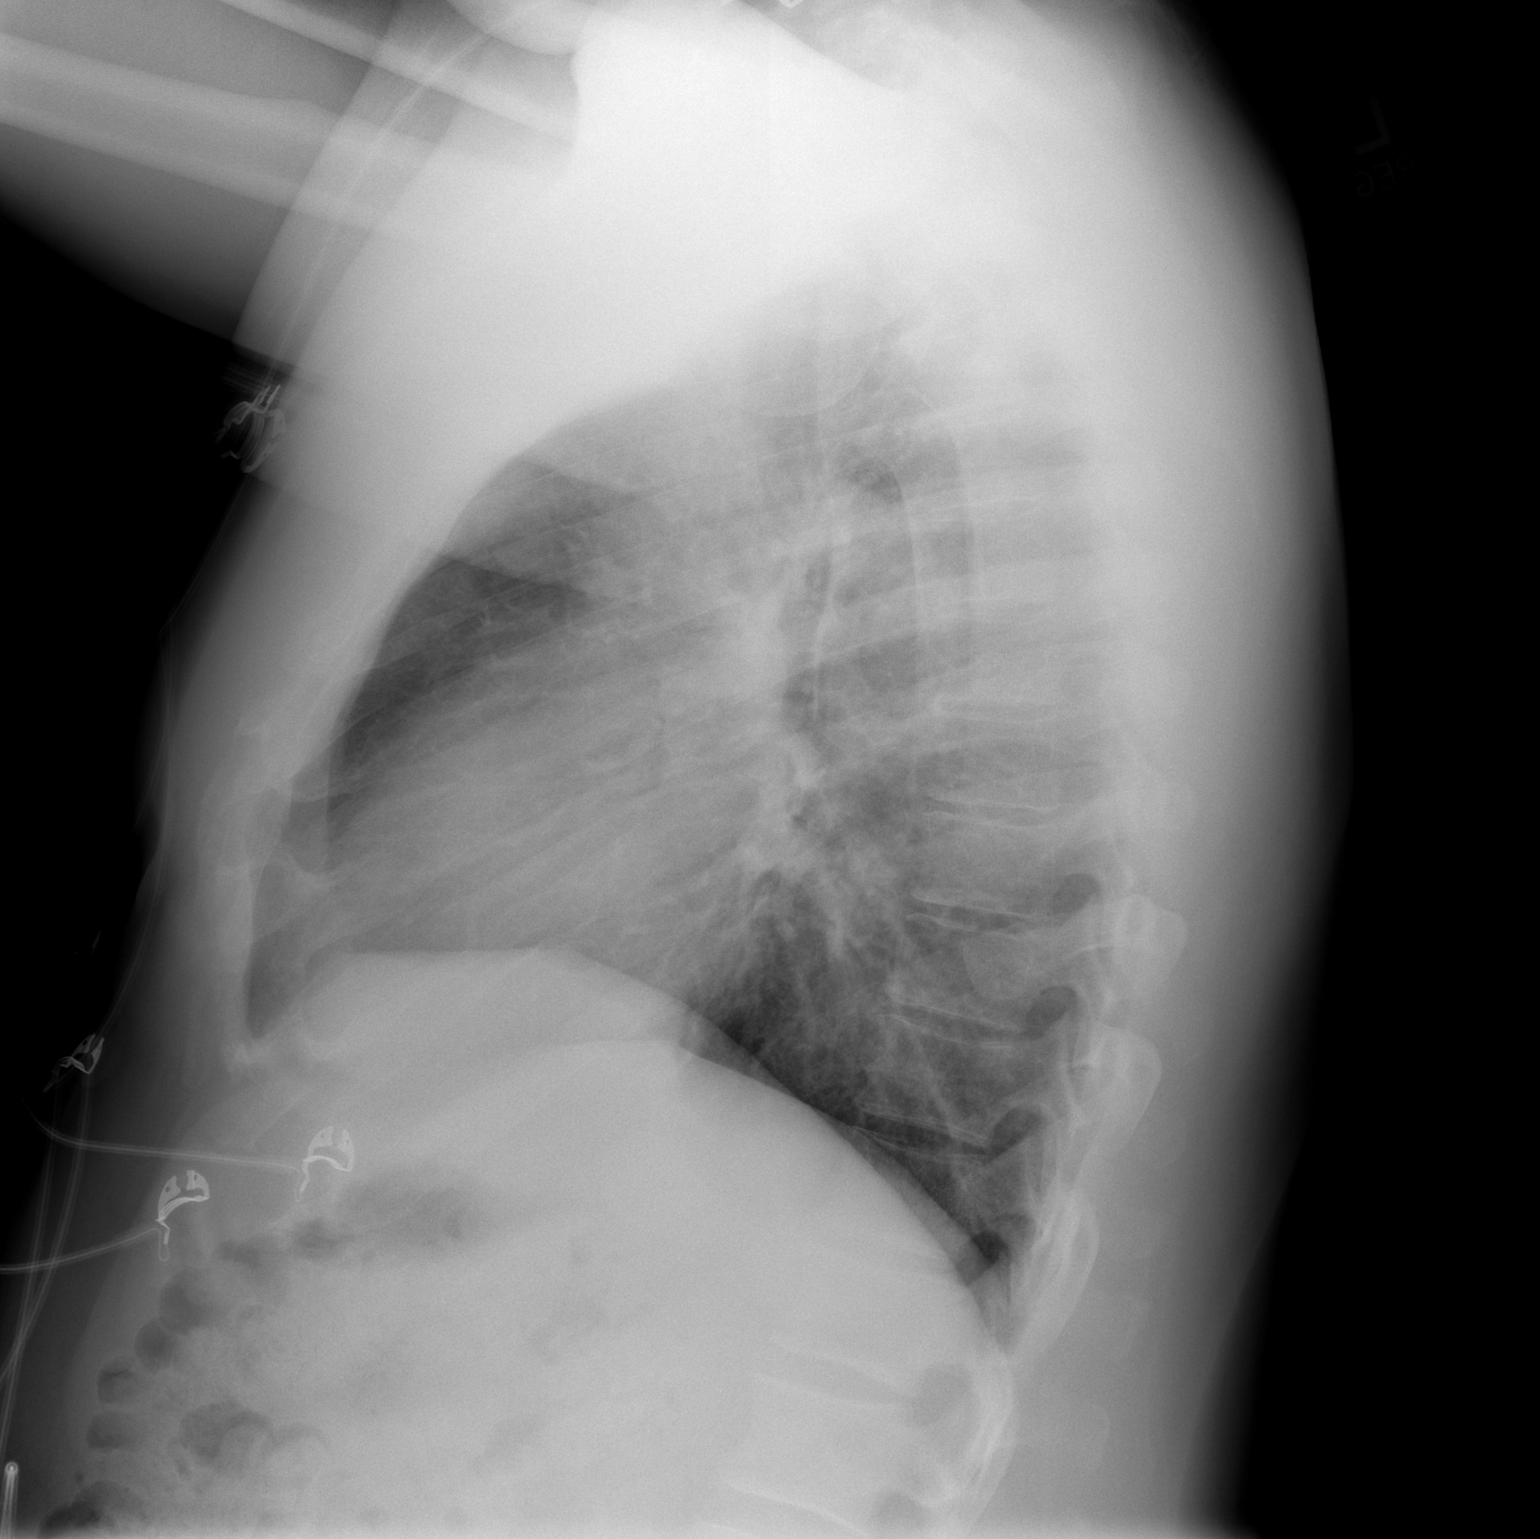

[2 of 2 positions shown; findings below may reference images not displayed]

FINDINGS: The lungs are clear and negative for focal airspace consolidation,
pulmonary edema or suspicious pulmonary nodule. No pleural effusion
or pneumothorax. Cardiac and mediastinal contours are within normal
limits. No acute fracture or lytic or blastic osseous lesions. The
visualized upper abdominal bowel gas pattern is unremarkable.
IMPRESSION: Negative chest x-ray.

## 2020-07-07 DIAGNOSIS — Z20828 Contact with and (suspected) exposure to other viral communicable diseases: Secondary | ICD-10-CM | POA: Diagnosis not present

## 2020-09-04 DIAGNOSIS — Z13220 Encounter for screening for lipoid disorders: Secondary | ICD-10-CM | POA: Diagnosis not present

## 2020-09-04 DIAGNOSIS — Z0000 Encounter for general adult medical examination without abnormal findings: Secondary | ICD-10-CM | POA: Diagnosis not present

## 2020-09-04 DIAGNOSIS — R030 Elevated blood-pressure reading, without diagnosis of hypertension: Secondary | ICD-10-CM | POA: Diagnosis not present

## 2020-09-10 DIAGNOSIS — R030 Elevated blood-pressure reading, without diagnosis of hypertension: Secondary | ICD-10-CM | POA: Diagnosis not present

## 2020-10-29 DIAGNOSIS — I10 Essential (primary) hypertension: Secondary | ICD-10-CM | POA: Diagnosis not present

## 2020-10-29 DIAGNOSIS — Z1211 Encounter for screening for malignant neoplasm of colon: Secondary | ICD-10-CM | POA: Diagnosis not present

## 2020-10-29 DIAGNOSIS — E663 Overweight: Secondary | ICD-10-CM | POA: Diagnosis not present

## 2020-10-29 DIAGNOSIS — E7800 Pure hypercholesterolemia, unspecified: Secondary | ICD-10-CM | POA: Diagnosis not present

## 2021-05-20 DIAGNOSIS — D123 Benign neoplasm of transverse colon: Secondary | ICD-10-CM | POA: Diagnosis not present

## 2021-05-20 DIAGNOSIS — Z1211 Encounter for screening for malignant neoplasm of colon: Secondary | ICD-10-CM | POA: Diagnosis not present

## 2021-11-01 DIAGNOSIS — E7800 Pure hypercholesterolemia, unspecified: Secondary | ICD-10-CM | POA: Diagnosis not present

## 2021-11-01 DIAGNOSIS — I10 Essential (primary) hypertension: Secondary | ICD-10-CM | POA: Diagnosis not present

## 2021-11-01 DIAGNOSIS — Z0000 Encounter for general adult medical examination without abnormal findings: Secondary | ICD-10-CM | POA: Diagnosis not present

## 2021-11-18 DIAGNOSIS — D582 Other hemoglobinopathies: Secondary | ICD-10-CM | POA: Diagnosis not present

## 2022-09-05 DIAGNOSIS — J039 Acute tonsillitis, unspecified: Secondary | ICD-10-CM | POA: Diagnosis not present

## 2022-09-05 DIAGNOSIS — J0110 Acute frontal sinusitis, unspecified: Secondary | ICD-10-CM | POA: Diagnosis not present

## 2022-09-05 DIAGNOSIS — I10 Essential (primary) hypertension: Secondary | ICD-10-CM | POA: Diagnosis not present

## 2022-11-23 DIAGNOSIS — E7800 Pure hypercholesterolemia, unspecified: Secondary | ICD-10-CM | POA: Diagnosis not present

## 2022-11-23 DIAGNOSIS — Z0000 Encounter for general adult medical examination without abnormal findings: Secondary | ICD-10-CM | POA: Diagnosis not present

## 2022-11-23 DIAGNOSIS — J302 Other seasonal allergic rhinitis: Secondary | ICD-10-CM | POA: Diagnosis not present

## 2022-11-23 DIAGNOSIS — I10 Essential (primary) hypertension: Secondary | ICD-10-CM | POA: Diagnosis not present

## 2023-06-23 DIAGNOSIS — J0100 Acute maxillary sinusitis, unspecified: Secondary | ICD-10-CM | POA: Diagnosis not present

## 2024-01-03 DIAGNOSIS — Z0000 Encounter for general adult medical examination without abnormal findings: Secondary | ICD-10-CM | POA: Diagnosis not present

## 2024-01-03 DIAGNOSIS — I10 Essential (primary) hypertension: Secondary | ICD-10-CM | POA: Diagnosis not present

## 2024-01-03 DIAGNOSIS — Z125 Encounter for screening for malignant neoplasm of prostate: Secondary | ICD-10-CM | POA: Diagnosis not present
